# Patient Record
Sex: Male | Born: 2000 | Race: White | Hispanic: No | Marital: Single | Smoking: Never smoker
Health system: Southern US, Community
[De-identification: ages and names within clinical notes are randomized; demographics above are authoritative.]

---

## 2020-06-09 ENCOUNTER — Other Ambulatory Visit: Payer: Self-pay

## 2020-06-09 ENCOUNTER — Encounter (HOSPITAL_COMMUNITY): Payer: Self-pay

## 2020-06-09 ENCOUNTER — Emergency Department (HOSPITAL_COMMUNITY): Payer: BC Managed Care – PPO

## 2020-06-09 ENCOUNTER — Emergency Department (HOSPITAL_COMMUNITY)
Admission: EM | Admit: 2020-06-09 | Discharge: 2020-06-10 | Disposition: A | Discharge status: Home / Self Care | Payer: BC Managed Care – PPO | Attending: Emergency Medicine | Admitting: Emergency Medicine

## 2020-06-09 DIAGNOSIS — N201 Calculus of ureter: Secondary | ICD-10-CM | POA: Diagnosis not present

## 2020-06-09 DIAGNOSIS — R109 Unspecified abdominal pain: Secondary | ICD-10-CM | POA: Diagnosis present

## 2020-06-09 DIAGNOSIS — I10 Essential (primary) hypertension: Secondary | ICD-10-CM | POA: Insufficient documentation

## 2020-06-09 LAB — BASIC METABOLIC PANEL
Anion gap: 12 (ref 5–15)
BUN: 15 mg/dL (ref 6–20)
CO2: 24 mmol/L (ref 22–32)
Calcium: 9.7 mg/dL (ref 8.9–10.3)
Chloride: 102 mmol/L (ref 98–111)
Creatinine, Ser: 1.43 mg/dL — ABNORMAL HIGH (ref 0.61–1.24)
GFR calc Af Amer: >60 mL/min (ref >60)
GFR calc non Af Amer: >60 mL/min (ref >60)
Glucose, Bld: 165 mg/dL — ABNORMAL HIGH (ref 70–99)
Potassium: 4.1 mmol/L (ref 3.5–5.1)
Sodium: 138 mmol/L (ref 135–145)

## 2020-06-09 LAB — URINALYSIS, ROUTINE W REFLEX MICROSCOPIC
Bilirubin Urine: NEGATIVE
Glucose, UA: NEGATIVE mg/dL
Hgb urine dipstick: NEGATIVE
Ketones, ur: NEGATIVE mg/dL
Nitrite: NEGATIVE
Protein, ur: NEGATIVE mg/dL
Specific Gravity, Urine: >1.03 — ABNORMAL HIGH (ref 1.005–1.030)
pH: 6 (ref 5.0–8.0)

## 2020-06-09 LAB — HEPATIC FUNCTION PANEL
ALT: 17 U/L (ref 0–44)
AST: 22 U/L (ref 15–41)
Albumin: 5 g/dL (ref 3.5–5.0)
Alkaline Phosphatase: 44 U/L (ref 38–126)
Bilirubin, Direct: 0.1 mg/dL (ref 0.0–0.2)
Indirect Bilirubin: 1.2 mg/dL — ABNORMAL HIGH (ref 0.3–0.9)
Total Bilirubin: 1.3 mg/dL — ABNORMAL HIGH (ref 0.3–1.2)
Total Protein: 7.4 g/dL (ref 6.5–8.1)

## 2020-06-09 LAB — URINALYSIS, MICROSCOPIC (REFLEX)

## 2020-06-09 LAB — CBC
HCT: 48 % (ref 39.0–52.0)
Hemoglobin: 15.8 g/dL (ref 13.0–17.0)
MCH: 29.6 pg (ref 26.0–34.0)
MCHC: 32.9 g/dL (ref 30.0–36.0)
MCV: 89.9 fL (ref 80.0–100.0)
Platelets: 244 10*3/uL (ref 150–400)
RBC: 5.34 MIL/uL (ref 4.22–5.81)
RDW: 11.9 % (ref 11.5–15.5)
WBC: 18.6 10*3/uL — ABNORMAL HIGH (ref 4.0–10.5)
nRBC: 0 % (ref 0.0–0.2)

## 2020-06-09 LAB — LIPASE, BLOOD: Lipase: 18 U/L (ref 11–51)

## 2020-06-09 MED ORDER — MORPHINE SULFATE (PF) 2 MG/ML IV SOLN
2.0000 mg | Freq: Once | INTRAVENOUS | Status: AC
  Administered 2020-06-09: 2 mg via INTRAVENOUS
  Filled 2020-06-09: qty 1

## 2020-06-09 MED ORDER — HYDROCODONE-ACETAMINOPHEN 5-325 MG PO TABS: 1.0000 | ORAL_TABLET | ORAL | 0 refills | Status: AC | PRN

## 2020-06-09 MED ORDER — SODIUM CHLORIDE 0.9 % IV BOLUS
1000.0000 mL | Freq: Once | INTRAVENOUS | Status: AC
  Administered 2020-06-09: 1000 mL via INTRAVENOUS

## 2020-06-09 MED ORDER — ONDANSETRON 4 MG PO TBDP: 4.0000 mg | ORAL_TABLET | Freq: Three times a day (TID) | ORAL | 0 refills | Status: AC | PRN

## 2020-06-09 MED ORDER — KETOROLAC TROMETHAMINE 15 MG/ML IJ SOLN
15.0000 mg | Freq: Once | INTRAMUSCULAR | Status: AC
  Administered 2020-06-09: 15 mg via INTRAVENOUS
  Filled 2020-06-09: qty 1

## 2020-06-09 MED ORDER — TAMSULOSIN HCL 0.4 MG PO CAPS: 0.4000 mg | ORAL_CAPSULE | Freq: Every day | ORAL | 0 refills | Status: AC

## 2020-06-09 MED ORDER — HYDROCODONE-ACETAMINOPHEN 5-325 MG PO TABS
1.0000 | ORAL_TABLET | Freq: Once | ORAL | Status: AC
  Administered 2020-06-09: 1 via ORAL
  Filled 2020-06-09: qty 1

## 2020-06-09 NOTE — ED Provider Notes (Signed)
[facility] EMERGENCY DEPARTMENT Provider Note   CSN: [id number] Arrival date & time: 06/09/20  1456     History   Chief Complaint Chief Complaint  Patient presents with  . Flank Pain    HPI Rodney Townsend is a 19 y.o. male who presents due to left flank pain pain that started around 12:00. Patient notes pain onset today while he was in class and he started sweating. Since onset, pain has been waxing and waning. Pain is described as sharp. Pain is exacerbated with movement, and there is nothing he can find that improves it. Pain radiates to abdomen and back. Patient notes associated nausea with one episode of NBNB emesis. Patient has tried 200 mg ibuprofen around 18:00 for his symptoms without relief. Patient denies personal history of UTI or kidney stones. Mother notes family history of kidney stones in multiple family members. Denies any fever, chills, diarrhea, chest pain, shortness of breath, cough, headaches, dizziness, loss of bowel or bladder control, numbness/tingling, dysuria, hematuria.      HPI  History reviewed. No pertinent past medical history.  There are no problems to display for this patient.   History reviewed. No pertinent surgical history.      Home Medications    Prior to Admission medications   Not on File    Family History History reviewed. No pertinent family history.  Social History Social History   Tobacco Use  . Smoking status: Never Smoker  . Smokeless tobacco: Never Used  Substance Use Topics  . Alcohol use: Never  . Drug use: Never     Allergies   Patient has no known allergies.   Review of Systems Review of Systems  Constitutional: Positive for diaphoresis. Negative for activity change and fever.  HENT: Negative for congestion and trouble swallowing.   Eyes: Negative for discharge and redness.  Respiratory: Negative for cough and wheezing.   Cardiovascular: Negative for chest pain.  Gastrointestinal: Positive for  abdominal pain. Negative for diarrhea and vomiting.  Genitourinary: Positive for flank pain (left). Negative for decreased urine volume and dysuria.  Musculoskeletal: Positive for back pain. Negative for gait problem and neck stiffness.  Skin: Negative for rash and wound.  Neurological: Negative for seizures and syncope.  Hematological: Does not bruise/bleed easily.  All other systems reviewed and are negative.   Physical Exam Updated Vital Signs BP (!) 153/85   Pulse 70   Temp 98.4 F (36.9 C) (Oral)   Resp 18   Ht 6' (1.829 m)   Wt 165 lb (74.8 kg)   SpO2 100%   BMI 22.38 kg/m    Physical Exam Vitals and nursing note reviewed.  Constitutional:      General: He is not in acute distress.    Appearance: He is well-developed.  HENT:     Head: Normocephalic and atraumatic.     Nose: Nose normal.  Eyes:     Conjunctiva/sclera: Conjunctivae normal.  Cardiovascular:     Rate and Rhythm: Normal rate and regular rhythm.  Pulmonary:     Effort: Pulmonary effort is normal. No respiratory distress.  Abdominal:     General: There is no distension.     Palpations: Abdomen is soft.     Tenderness: There is abdominal tenderness in the left upper quadrant and left lower quadrant. There is left CVA tenderness.  Musculoskeletal:        General: Normal range of motion.     Cervical back: Normal range of motion and neck supple.  Skin:    General: Skin is warm.     Capillary Refill: Capillary refill takes 2 to 3 seconds.     Findings: No rash.  Neurological:     Mental Status: He is alert and oriented to person, place, and time.      ED Treatments / Results  Labs (all labs ordered are listed, but only abnormal results are displayed) Labs Reviewed  URINALYSIS, ROUTINE W REFLEX MICROSCOPIC - Abnormal; Notable for the following components:      Result Value   APPearance CLOUDY (*)    Specific Gravity, Urine >1.030 (*)    Leukocytes,Ua TRACE (*)    All other components within  normal limits  BASIC METABOLIC PANEL - Abnormal; Notable for the following components:   Glucose, Bld 165 (*)    Creatinine, Ser 1.43 (*)    All other components within normal limits  CBC - Abnormal; Notable for the following components:   WBC 18.6 (*)    All other components within normal limits  URINALYSIS, MICROSCOPIC (REFLEX) - Abnormal; Notable for the following components:   Bacteria, UA RARE (*)    All other components within normal limits    EKG    Radiology No results found.  Procedures Procedures (including critical care time)  Medications Ordered in ED Medications  sodium chloride 0.9 % bolus 1,000 mL (has no administration in time range)     Initial Impression / Assessment and Plan / ED Course  I have reviewed the triage vital signs and the nursing notes.  Pertinent labs & imaging results that were available during my care of the patient were reviewed by me and considered in my medical decision making (see chart for details).        19 y.o. male with colicky left flank pain associated with nausea, vomiting and diaphoresis. Family history of kidney stones, but no personal history. In addition to urolithiasis, also on the differential is pyelonephritis or early gastroenteritis.   CBC with WBC 18.6 and CMP obtained. UA is negative for hgb or RBCs. US renal stone is negative for obstructive uropathy. Due to high clinical suspicion for urolithiasis, CT renal stone obtained and does show ureterolithiasis. It is possible that he will be able to pass a stone of this size on his own. He is afebrile and his urine does not appear infected.  When pain control was improved and patient received NS bolus. Discharged with Flomax, Zofran, Norco and strainer. Close follow up with PCP stressed. Also discussed reasons for ED return and patient and his mother expressed understanding.  Final Clinical Impressions(s) / ED Diagnoses   Final diagnoses:  Ureterolithiasis  Left flank  pain  Hypertension, unspecified type    ED Discharge Orders         Ordered    tamsulosin (FLOMAX) 0.4 MG CAPS capsule  Daily        06/09/20 2337    HYDROcodone-acetaminophen (NORCO/VICODIN) 5-325 MG tablet  Every 4 hours PRN        06/09/20 2337    ondansetron (ZOFRAN ODT) 4 MG disintegrating tablet  Every 8 hours PRN        06/09/20 2337          Vicki Mallet, MD     I, Erasmo Downer, acting as a scribe for Vicki Mallet, MD, have documented all relevant documentation on the behalf of and as directed by them while in their presence.    Vicki Mallet, MD 06/27/20 1313

## 2020-06-09 NOTE — ED Notes (Signed)
Patient transported to CT 

## 2020-06-09 NOTE — ED Triage Notes (Signed)
Pt presents with Left flank pain radiating around to his abd and one episode of diaphoresis. Pt denies any hx of kidney stone or dysuria

## 2020-06-10 NOTE — ED Notes (Signed)
Discharge papers discussed with pt caregiver. Discussed s/sx to return, follow up with PCP, medications given/next dose due. Caregiver verbalized understanding.  ?

## 2021-06-25 DIAGNOSIS — J342 Deviated nasal septum: Secondary | ICD-10-CM | POA: Diagnosis not present

## 2021-06-25 DIAGNOSIS — J358 Other chronic diseases of tonsils and adenoids: Secondary | ICD-10-CM | POA: Diagnosis not present

## 2021-06-25 DIAGNOSIS — J3489 Other specified disorders of nose and nasal sinuses: Secondary | ICD-10-CM | POA: Diagnosis not present

## 2021-06-25 DIAGNOSIS — Z8709 Personal history of other diseases of the respiratory system: Secondary | ICD-10-CM | POA: Diagnosis not present

## 2021-07-08 DIAGNOSIS — R339 Retention of urine, unspecified: Secondary | ICD-10-CM | POA: Diagnosis not present

## 2021-07-08 DIAGNOSIS — K5900 Constipation, unspecified: Secondary | ICD-10-CM | POA: Diagnosis not present

## 2021-07-11 DIAGNOSIS — R338 Other retention of urine: Secondary | ICD-10-CM | POA: Diagnosis not present

## 2021-07-11 DIAGNOSIS — B957 Other staphylococcus as the cause of diseases classified elsewhere: Secondary | ICD-10-CM | POA: Diagnosis not present

## 2021-07-11 DIAGNOSIS — N390 Urinary tract infection, site not specified: Secondary | ICD-10-CM | POA: Diagnosis not present

## 2021-07-13 ENCOUNTER — Encounter: Payer: Self-pay | Admitting: Gastroenterology

## 2021-07-13 DIAGNOSIS — R338 Other retention of urine: Secondary | ICD-10-CM | POA: Diagnosis not present

## 2021-08-24 ENCOUNTER — Ambulatory Visit: Payer: BC Managed Care – PPO | Admitting: Gastroenterology

## 2021-09-04 IMAGING — CT CT RENAL STONE PROTOCOL
2 of 4 series · 16 of 46 positions shown, 18 images · non-contrast
Comparison: Ultrasound from earlier in the same day.

CLINICAL DATA: Left-sided flank pain for 1 day, initial encounter

EXAM:
CT ABDOMEN AND PELVIS WITHOUT CONTRAST
TECHNIQUE: Multidetector CT imaging of the abdomen and pelvis was performed
following the standard protocol without IV contrast.

[Series 3: stone study 5.0 i30f 2 · axial · 0.72mm/px · z∈[-489,-89]mm · 13 of 90 slices shown, 15 images]
[im 5/90  soft-tissue]
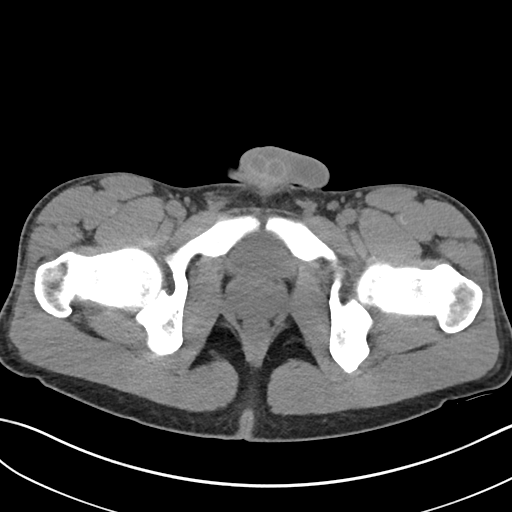
[im 5/90  bone]
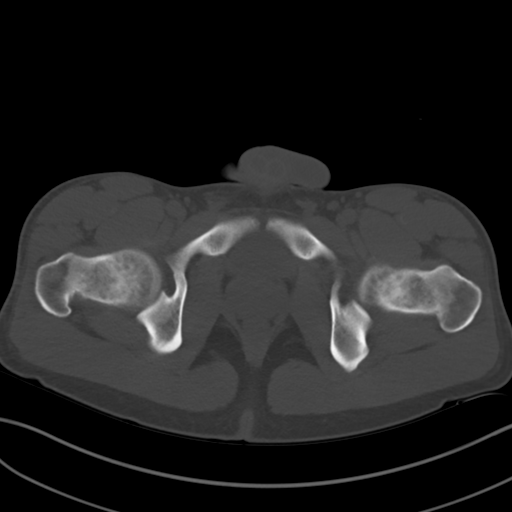
[im 13/90  soft-tissue]
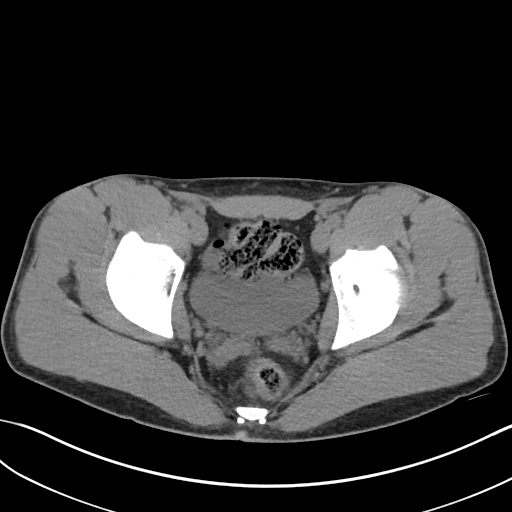
[im 21/90  soft-tissue]
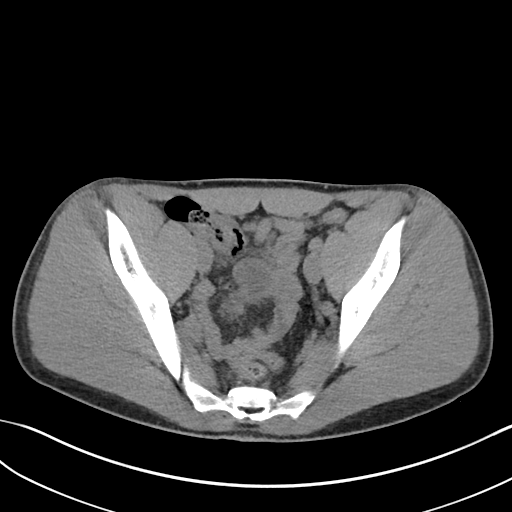
[im 25/90  soft-tissue]
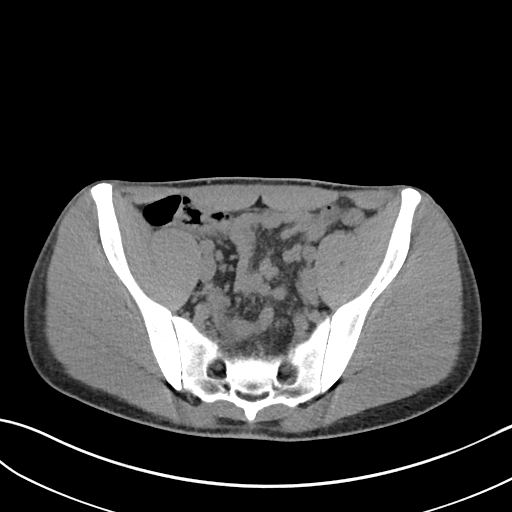
[im 33/90  soft-tissue]
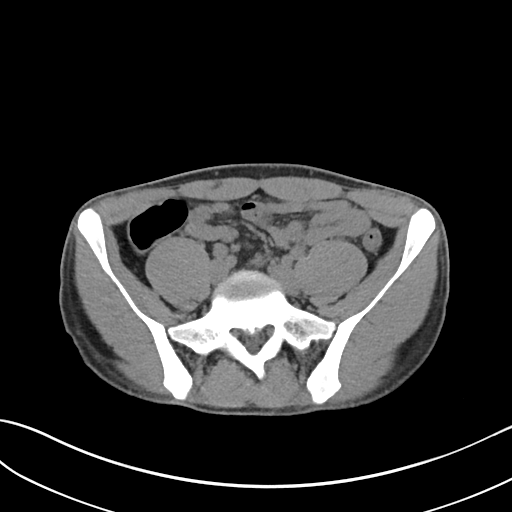
[im 37/90  soft-tissue]
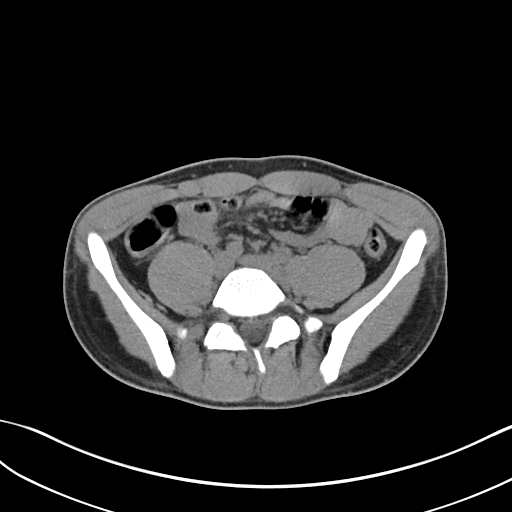
[im 45/90  soft-tissue]
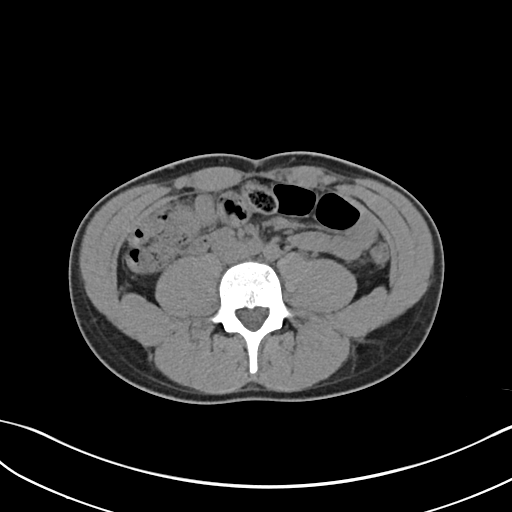
[im 53/90  soft-tissue]
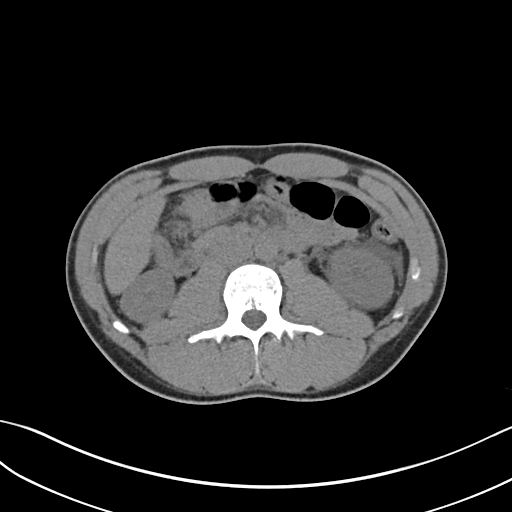
[im 57/90  soft-tissue]
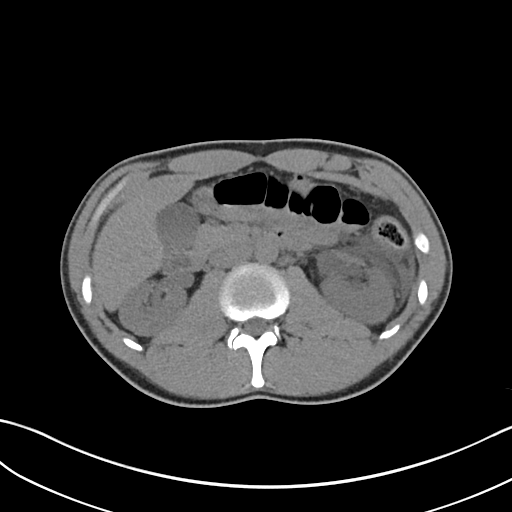
[im 57/90  bone]
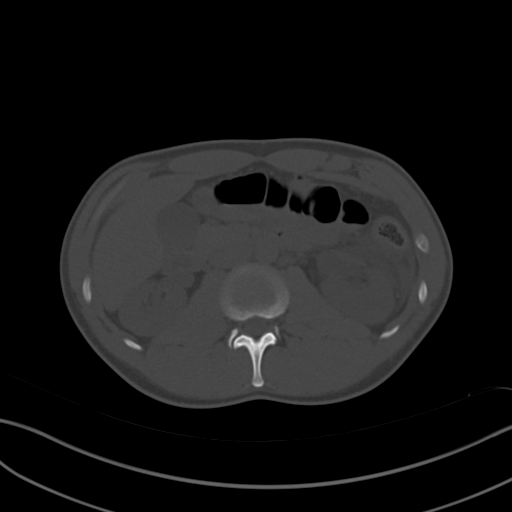
[im 65/90  soft-tissue]
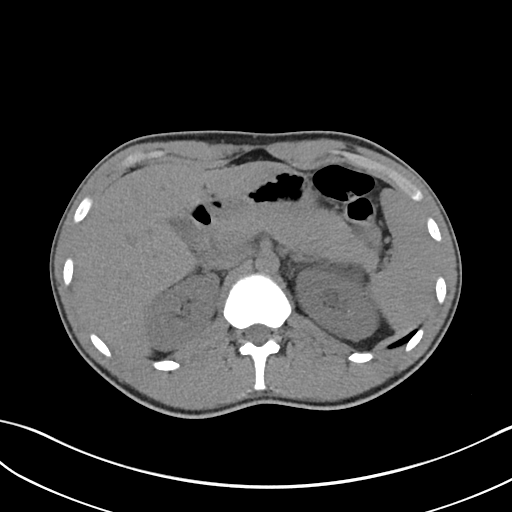
[im 69/90  soft-tissue]
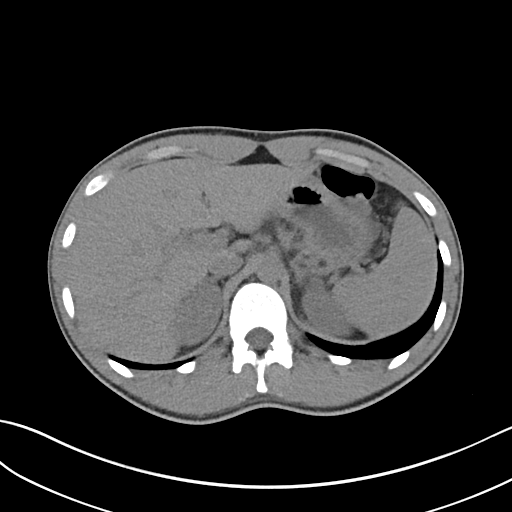
[im 77/90  soft-tissue]
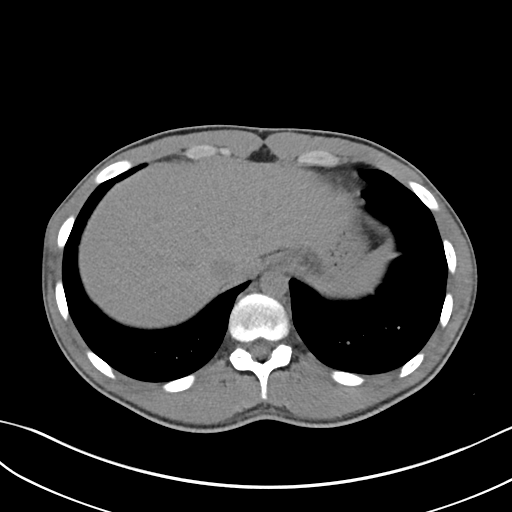
[im 85/90  soft-tissue]
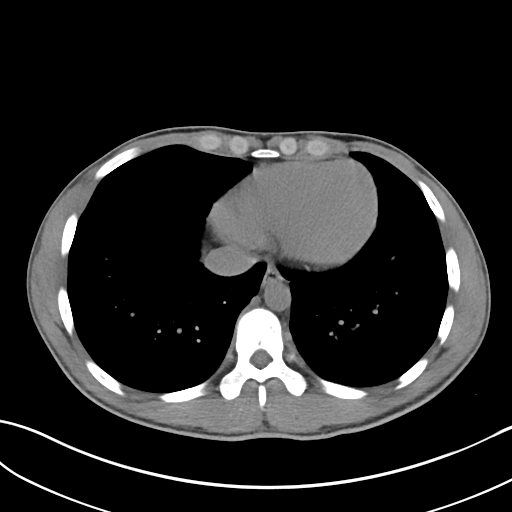

[Series 6: coronal soft tissue · coronal · 0.72mm/px · 3 of 82 slices shown]
[im 28/82  soft-tissue]
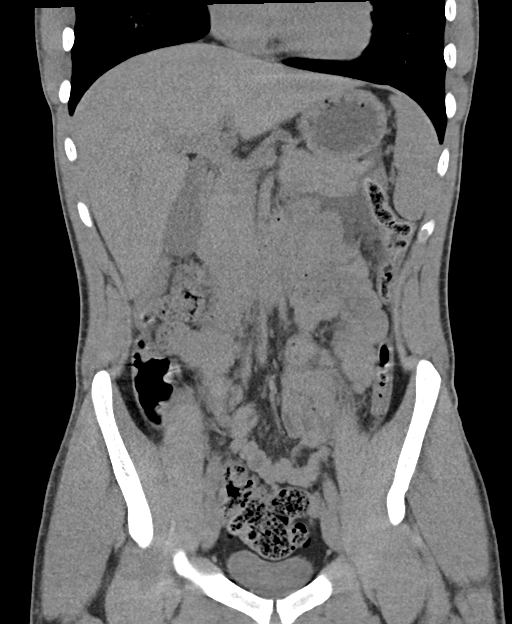
[im 37/82  soft-tissue]
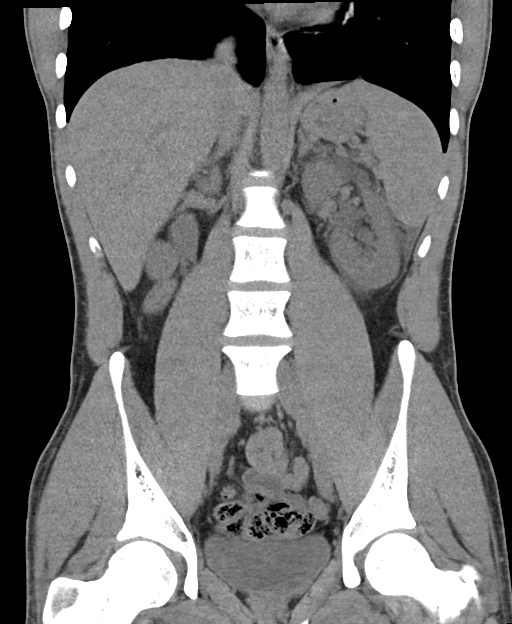
[im 46/82  soft-tissue]
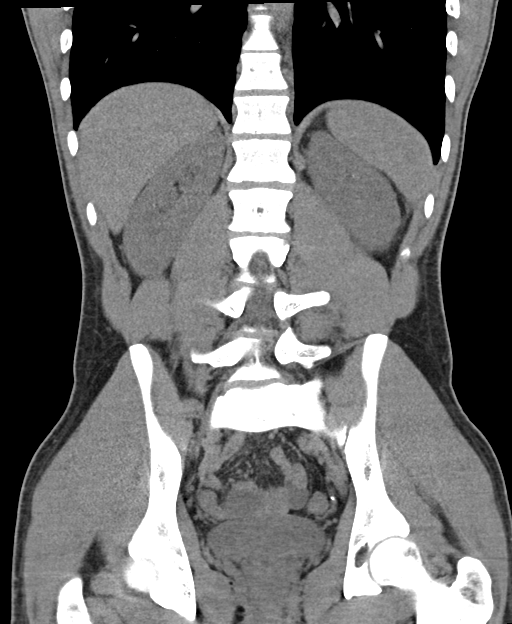

[16 of 46 positions shown; findings below may reference images not displayed]

FINDINGS: Lower chest: No acute abnormality.

Hepatobiliary: No focal liver abnormality is seen. No gallstones,
gallbladder wall thickening, or biliary dilatation.

Pancreas: Unremarkable. No pancreatic ductal dilatation or
surrounding inflammatory changes.

Spleen: Normal in size without focal abnormality.

Adrenals/Urinary Tract: Adrenal glands are unremarkable. Right
kidney is unremarkable as well. Left kidney demonstrates some mild
perinephric stranding which extends superiorly adjacent to the
spleen as well as inferiorly. Fullness of the left collecting system
is seen. Along the course of the distal left ureter there is a small
3-4 mm stone identified. Bladder is partially distended.

Stomach/Bowel: No obstructive or inflammatory changes of large or
small bowel are seen. The appendix is not well visualized. No
inflammatory changes to suggest appendicitis are noted. The stomach
is within normal limits.

Vascular/Lymphatic: No significant vascular findings are present. No
enlarged abdominal or pelvic lymph nodes.

Reproductive: Prostate is unremarkable.

Other: No abdominal wall hernia or abnormality. No abdominopelvic
ascites.

Musculoskeletal: No acute or significant osseous findings.
IMPRESSION: Perinephric stranding on the left with mild prominence of the left
collecting system and a small 3-4 mm distal left ureteral stone
identified.

No other focal abnormality is noted.

## 2021-09-04 IMAGING — US US RENAL
1 series · 14 of 25 positions shown · non-contrast
Comparison: None.

CLINICAL DATA: Left flank pain.

EXAM:
RENAL / URINARY TRACT ULTRASOUND COMPLETE

[Series 1: us renal · 14 of 90 slices shown]
[im 1/90]
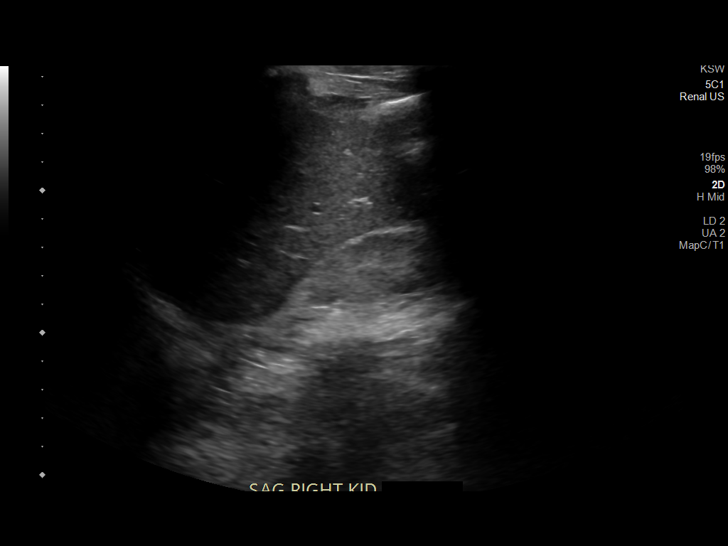
[im 8/90]
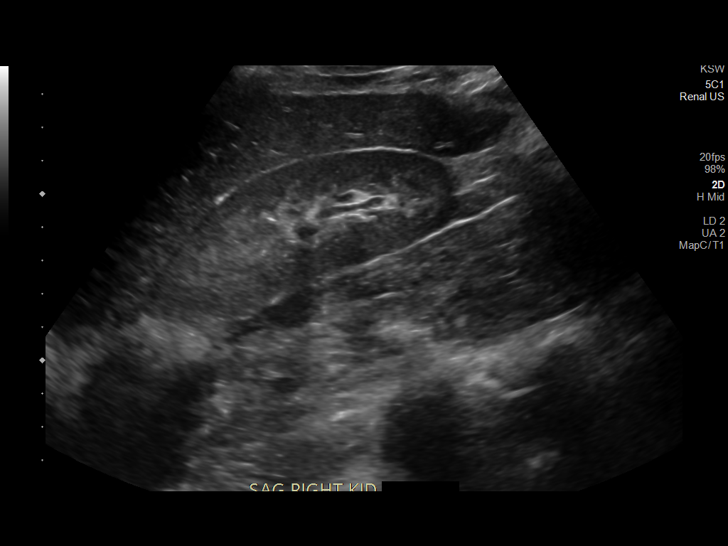
[im 15/90]
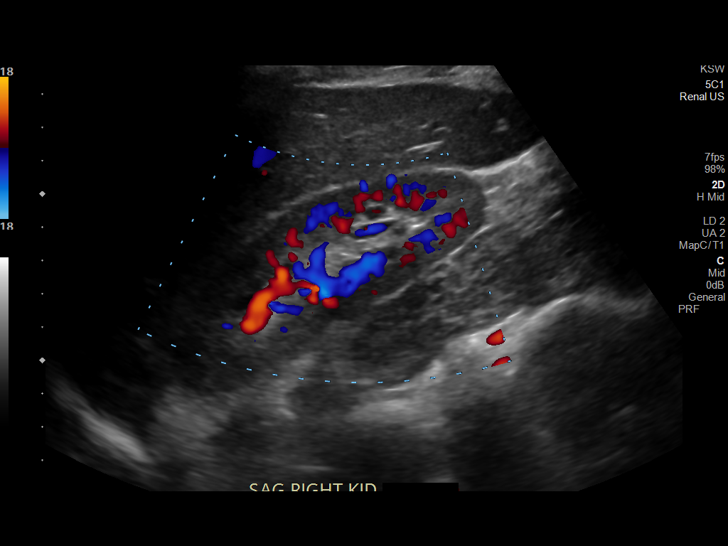
[im 23/90]
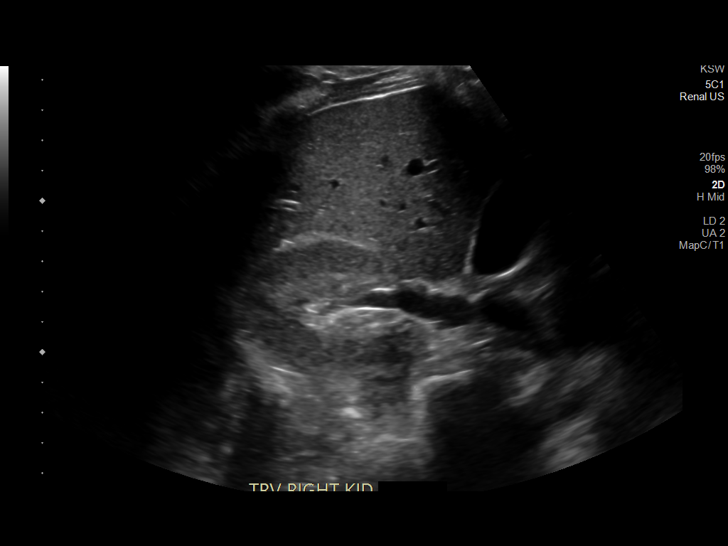
[im 30/90]
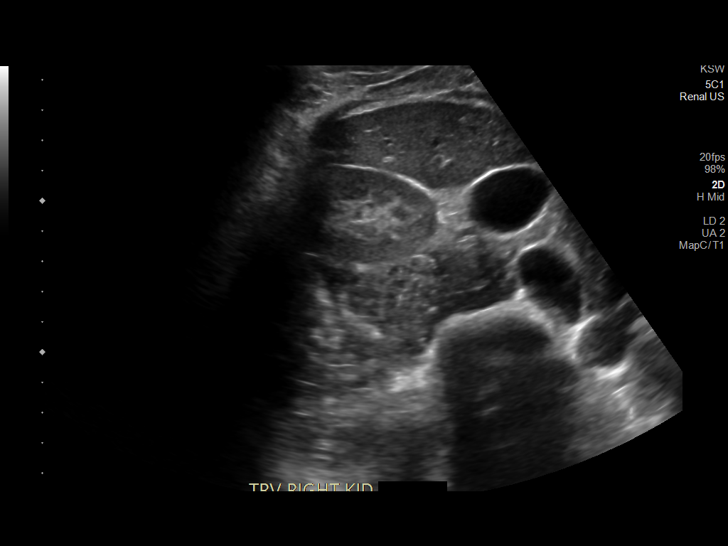
[im 34/90]
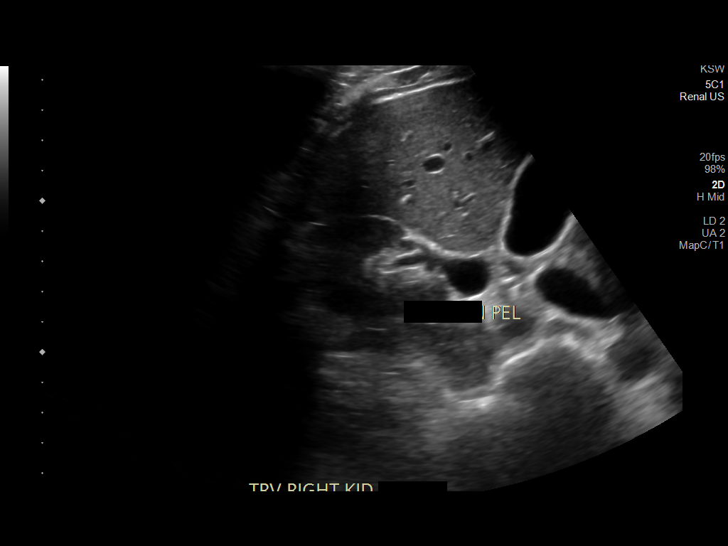
[im 41/90]
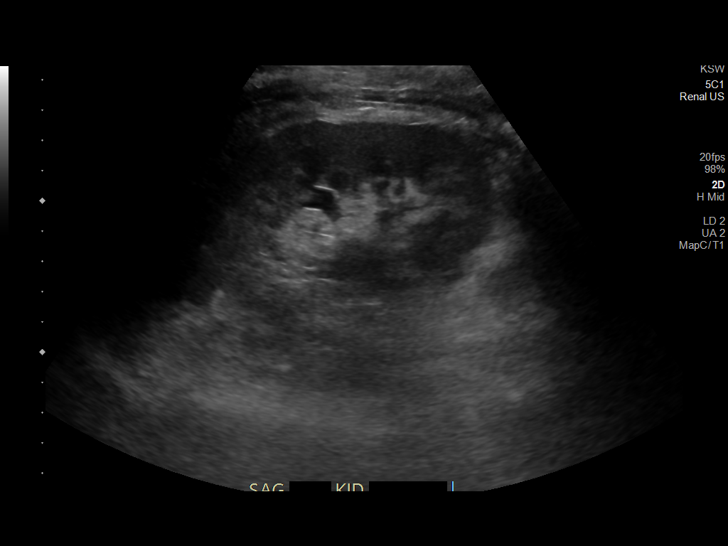
[im 49/90]
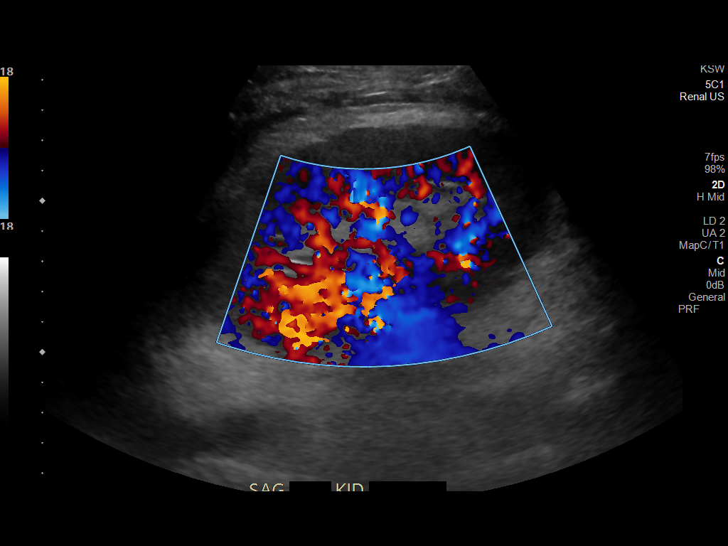
[im 56/90]
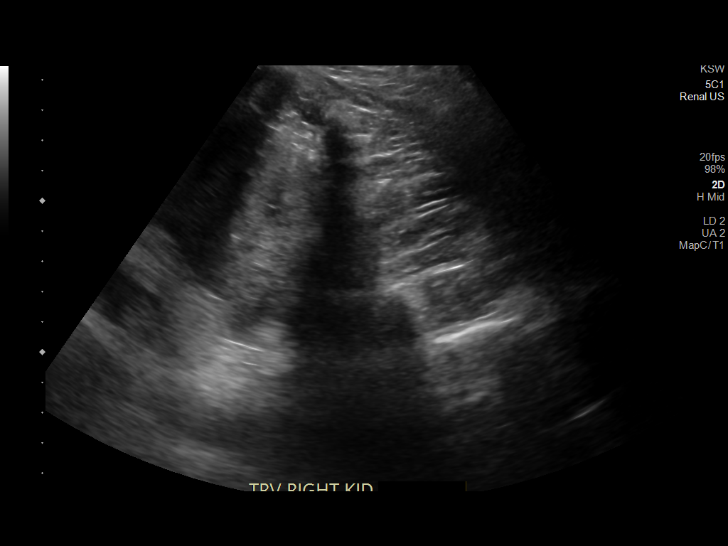
[im 60/90]
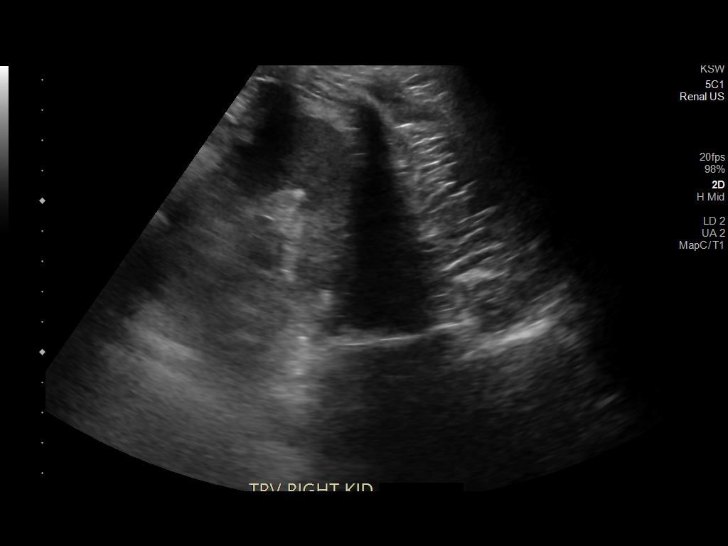
[im 67/90]
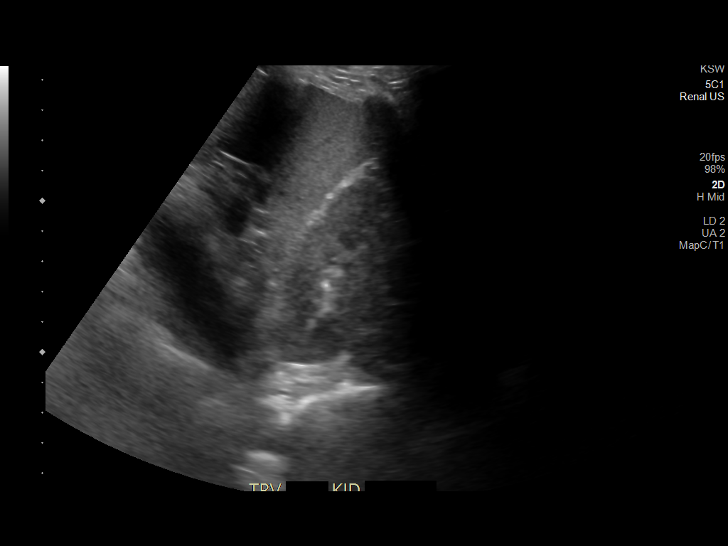
[im 75/90]
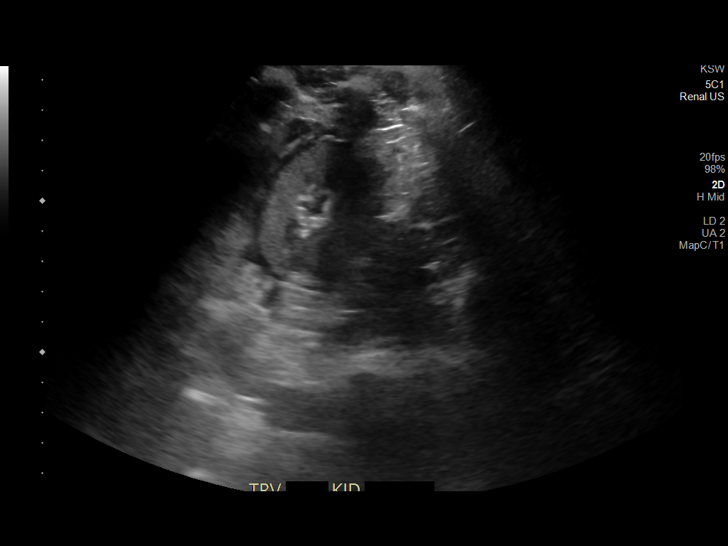
[im 82/90]
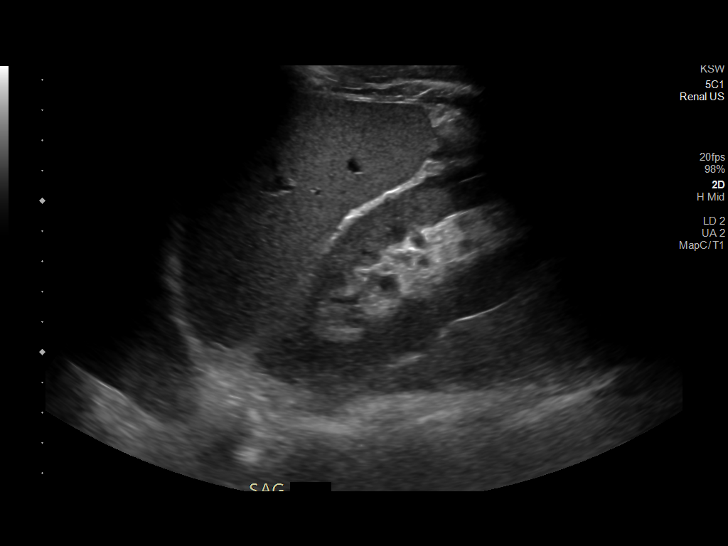
[im 90/90]
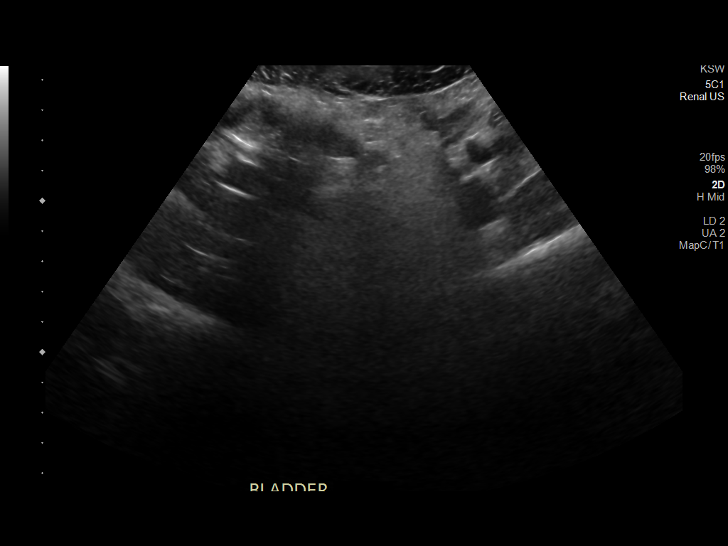

[14 of 25 positions shown; findings below may reference images not displayed]

FINDINGS: Right Kidney:

Renal measurements: 11.4 x 4.1 x 4.4 cm = volume: 109 mL.
Echogenicity within normal limits. No mass or hydronephrosis
visualized.

Left Kidney:

Renal measurements: 12.6 x 7.0 x 3.9 cm = volume: 179 mL.
Echogenicity within normal limits. No mass or hydronephrosis
visualized.

Bladder:

Decompressed.

Other:

None.
IMPRESSION: 1. Normal renal ultrasound.

## 2021-09-15 DIAGNOSIS — F419 Anxiety disorder, unspecified: Secondary | ICD-10-CM | POA: Diagnosis not present

## 2022-04-02 DIAGNOSIS — F419 Anxiety disorder, unspecified: Secondary | ICD-10-CM | POA: Diagnosis not present

## 2022-04-02 DIAGNOSIS — J019 Acute sinusitis, unspecified: Secondary | ICD-10-CM | POA: Diagnosis not present

## 2022-08-20 DIAGNOSIS — B079 Viral wart, unspecified: Secondary | ICD-10-CM | POA: Diagnosis not present

## 2023-09-24 DIAGNOSIS — F419 Anxiety disorder, unspecified: Secondary | ICD-10-CM | POA: Diagnosis not present

## 2023-09-24 DIAGNOSIS — Z13220 Encounter for screening for lipoid disorders: Secondary | ICD-10-CM | POA: Diagnosis not present

## 2023-09-24 DIAGNOSIS — R635 Abnormal weight gain: Secondary | ICD-10-CM | POA: Diagnosis not present
